# Patient Record
Sex: Female | Born: 1995 | Race: White | Hispanic: No | Marital: Single | Smoking: Never smoker
Health system: Southern US, Community
[De-identification: ages and names within clinical notes are randomized; demographics above are authoritative.]

## PROBLEM LIST (undated history)

## (undated) DIAGNOSIS — G809 Cerebral palsy, unspecified: Secondary | ICD-10-CM

## (undated) DIAGNOSIS — E119 Type 2 diabetes mellitus without complications: Secondary | ICD-10-CM

## (undated) HISTORY — PX: LEG SURGERY: SHX1003

---
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
## 2015-12-20 IMAGING — CR DG KNEE COMPLETE 4+V*R*
4 series · 4 of 4 positions shown · non-contrast
Comparison: None.

CLINICAL DATA: pain in and injury to rt knee today in weight class.
heard pop after twisting motion. hurts mostly around patella in all
directions. Initial encounter.

EXAM:
RIGHT KNEE - COMPLETE 4+ VIEW

[knee ap]
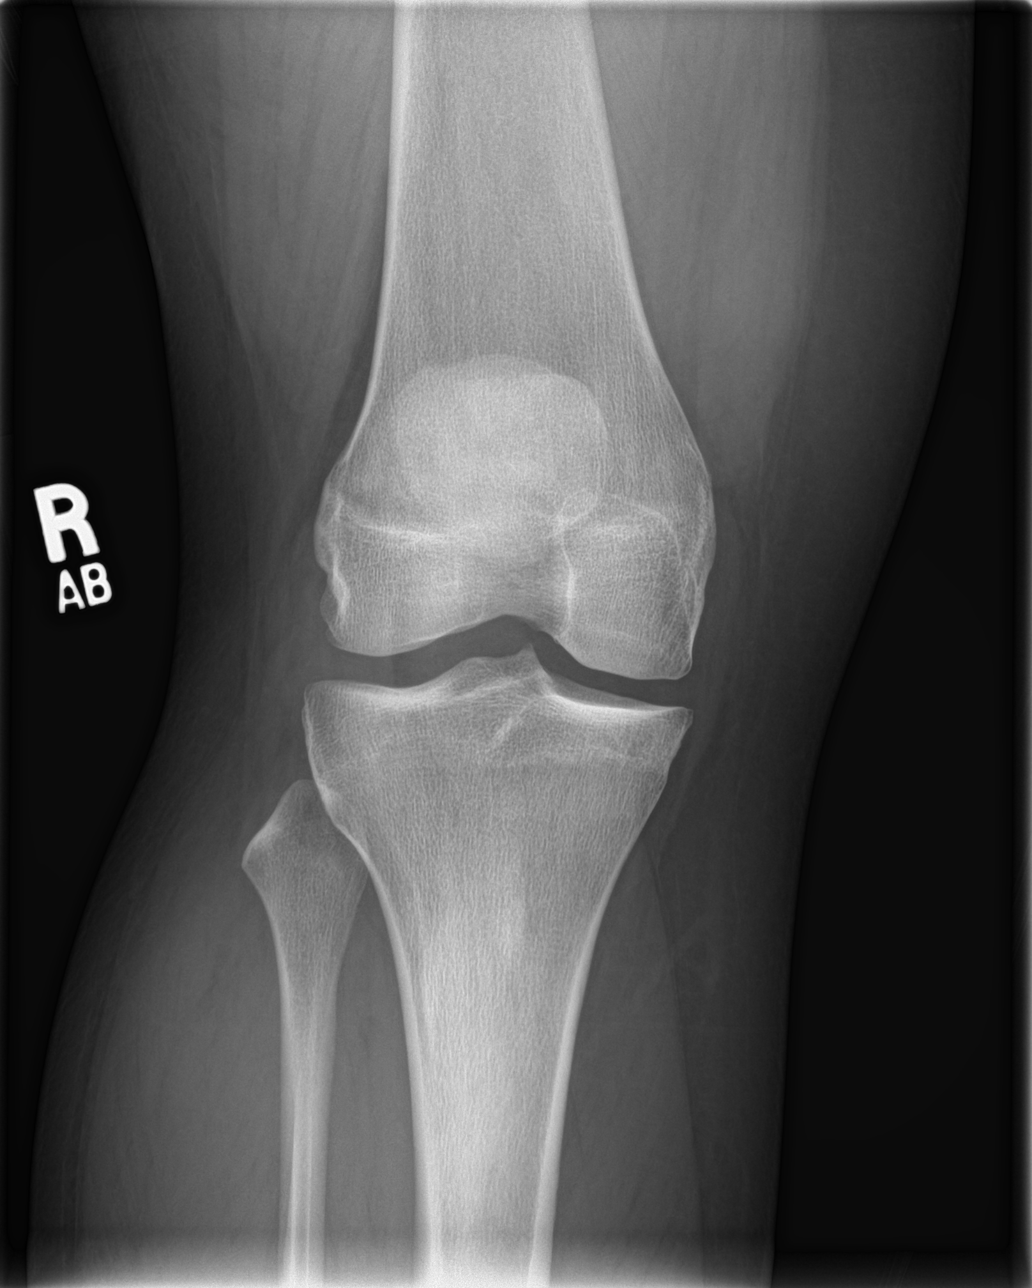

[knee obl (1 of 2)]
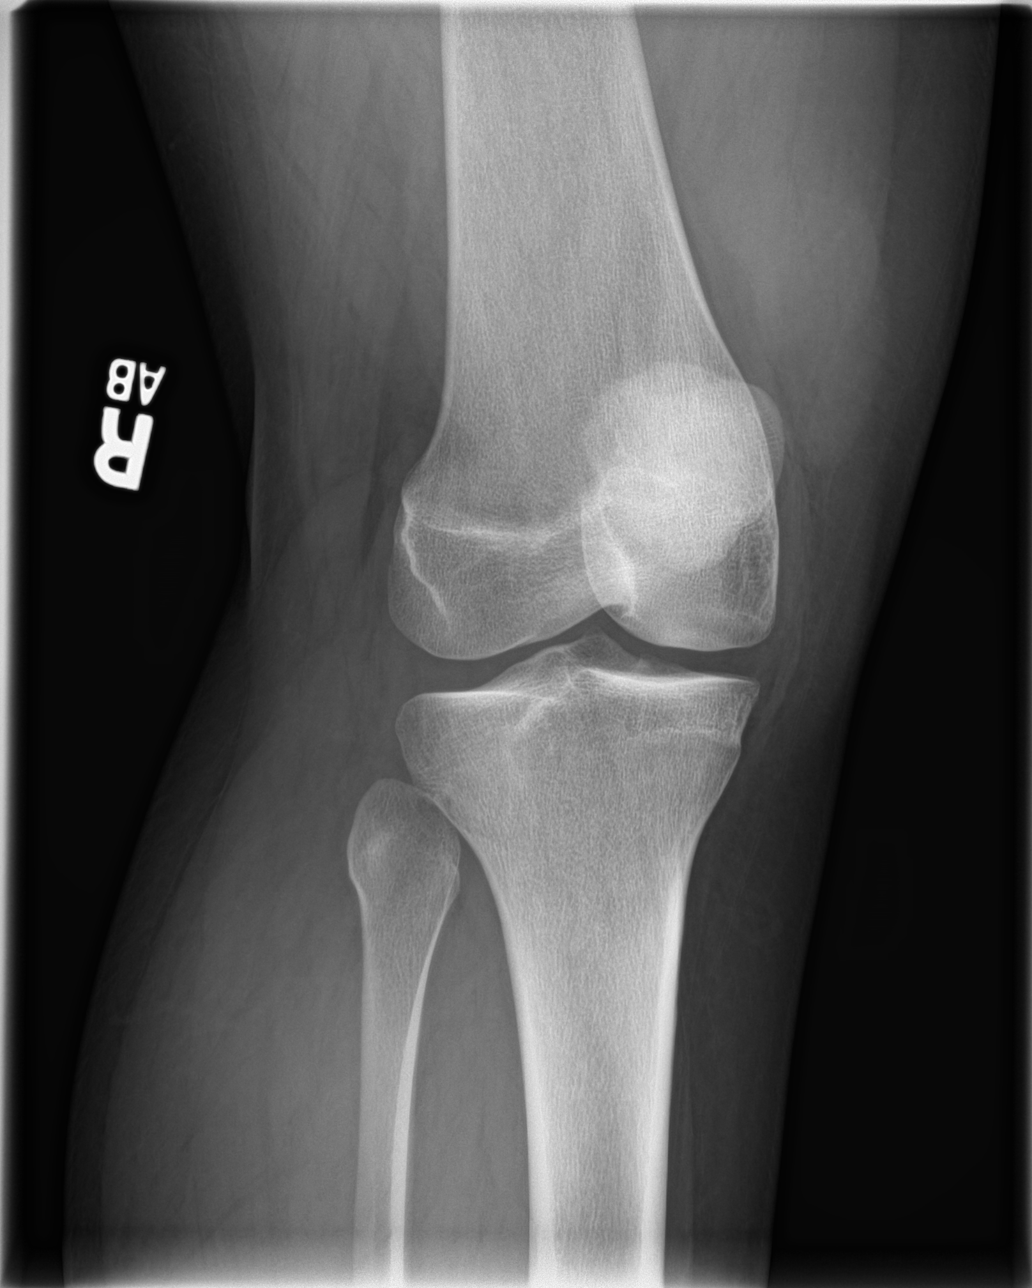

[knee obl (2 of 2)]
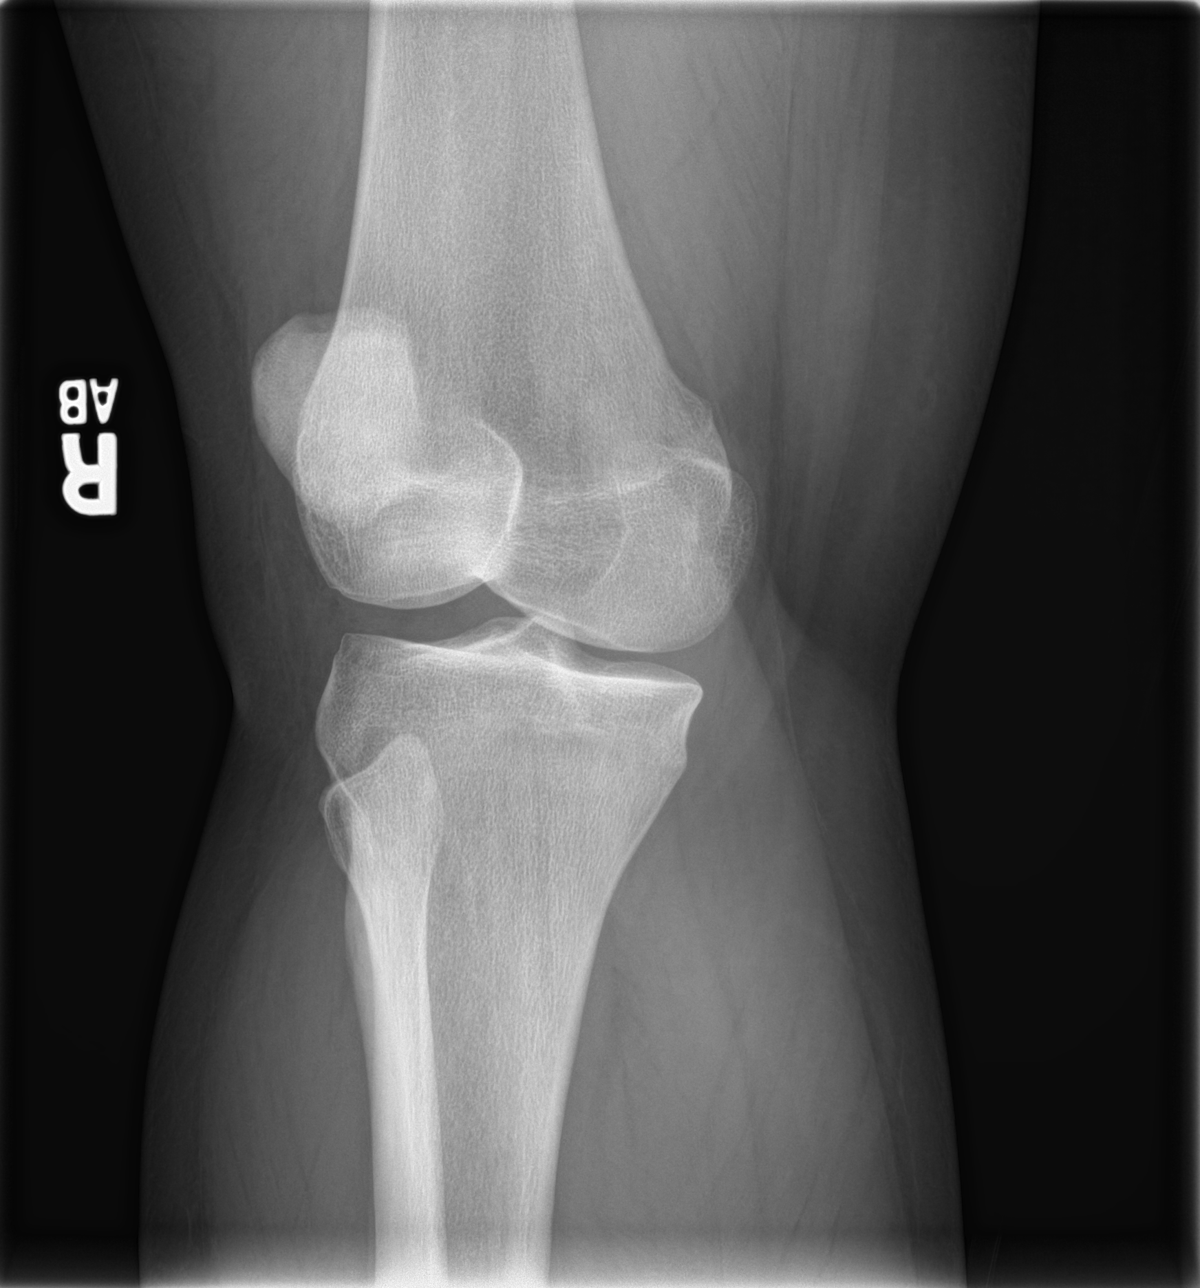

[knee lat]
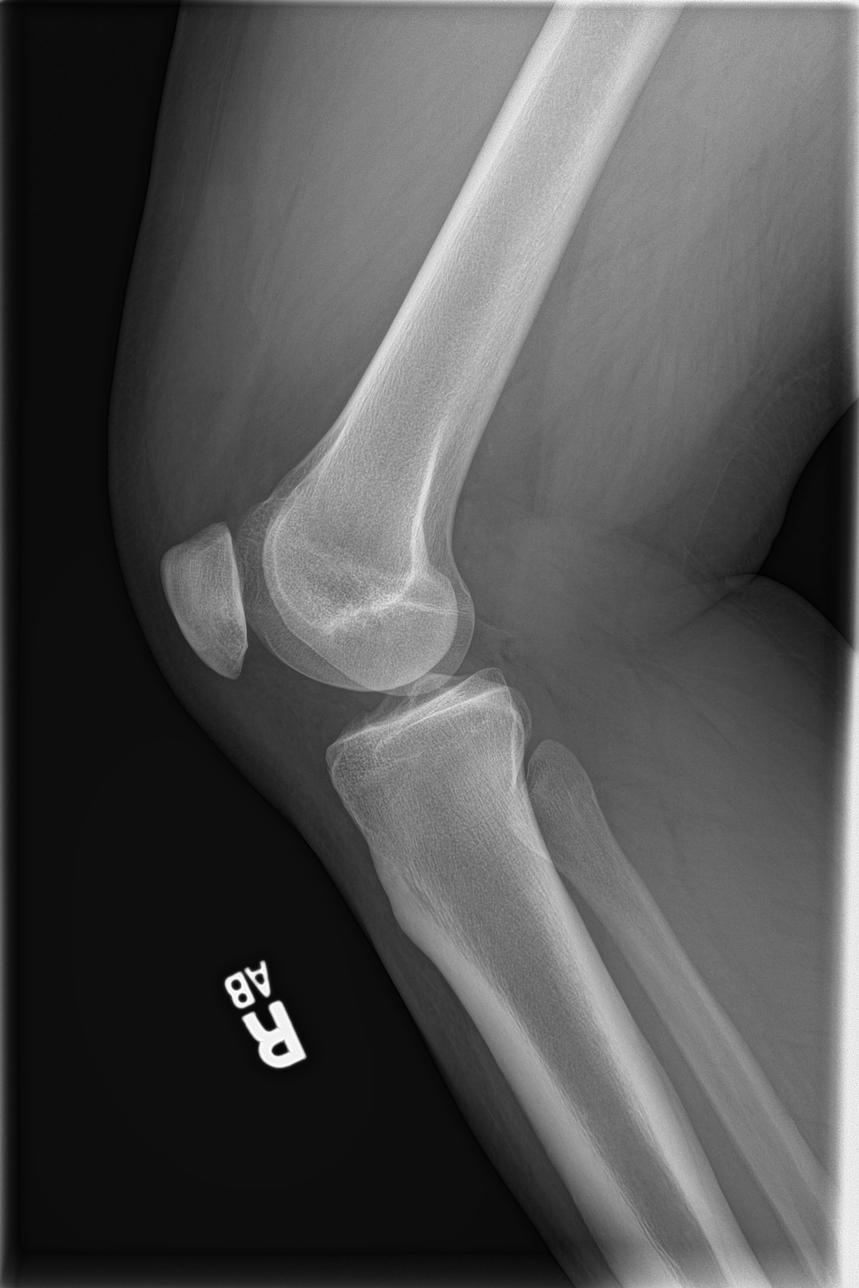

[4 of 4 positions shown; findings below may reference images not displayed]

FINDINGS: No acute fracture or dislocation.  No joint effusion.
IMPRESSION: No acute osseous abnormality.

## 2015-12-22 ENCOUNTER — Encounter: Payer: Self-pay | Admitting: *Deleted

## 2015-12-22 ENCOUNTER — Ambulatory Visit
Admission: EM | Admit: 2015-12-22 | Discharge: 2015-12-22 | Disposition: A | Discharge status: Home / Self Care | Payer: BLUE CROSS/BLUE SHIELD | Attending: Family Medicine | Admitting: Family Medicine

## 2015-12-22 DIAGNOSIS — S161XXA Strain of muscle, fascia and tendon at neck level, initial encounter: Secondary | ICD-10-CM | POA: Diagnosis not present

## 2015-12-22 DIAGNOSIS — S1091XA Abrasion of unspecified part of neck, initial encounter: Secondary | ICD-10-CM | POA: Diagnosis not present

## 2015-12-22 MED ORDER — TIZANIDINE HCL 4 MG PO CAPS: 4.0000 mg | ORAL_CAPSULE | Freq: Three times a day (TID) | ORAL | 0 refills | Status: DC

## 2015-12-22 MED ORDER — MUPIROCIN 2 % EX OINT: 1.0000 "application " | TOPICAL_OINTMENT | Freq: Three times a day (TID) | CUTANEOUS | 0 refills | Status: DC

## 2015-12-22 MED ORDER — NAPROXEN 500 MG PO TABS: 500.0000 mg | ORAL_TABLET | Freq: Two times a day (BID) | ORAL | 0 refills | Status: AC
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
## 2016-10-24 ENCOUNTER — Ambulatory Visit
Admission: EM | Admit: 2016-10-24 | Discharge: 2016-10-24 | Disposition: A | Discharge status: Home / Self Care | Payer: BLUE CROSS/BLUE SHIELD

## 2016-10-24 DIAGNOSIS — S46211A Strain of muscle, fascia and tendon of other parts of biceps, right arm, initial encounter: Secondary | ICD-10-CM

## 2016-10-24 NOTE — ED Provider Notes (Signed)
[facility]    CSN: [id number] Arrival date & time: 10/24/16  1323     History   Chief Complaint No chief complaint on file.   HPI Leslie Kent is a 21 y.o. female.   HPI  This a 21 year old female since with left anterior shoulder pain. She states that Monday 3 days prior to this visit she was helping her father fix a  lawnmower. They were attempting to change a belt. She was pulling hard on various tools and noticed afterwards that she began to have left shoulder pain. It bothered her particularly more that evening and has persisted since then. She's had no radicular symptoms.   the area of most pain is anterior over the coracoid area but does feel like he goes through her shoulder and slightly into her posterior shoulder. It hurts to move her arm. She's had no upper extremity radicular symptoms. Her neck is not uncomfortable.       Past Medical History:  Diagnosis Date  . Cerebral palsy (HCC)     There are no active problems to display for this patient.   Past Surgical History:  Procedure Laterality Date  . LEG SURGERY Left    Age 82    OB History    No data available       Home Medications    Prior to Admission medications   Medication Sig Start Date End Date Taking? Authorizing Provider  geriatric multivitamins-minerals (ELDERTONIC/GEVRABON) ELIX Take 15 mLs by mouth daily.    [provider]  mupirocin ointment (BACTROBAN) 2 % Apply 1 application topically 3 (three) times daily. 12/22/15   Lutricia Feiloemer, Cyndy Braver P, PA-C  naproxen (NAPROSYN) 500 MG tablet Take 1 tablet (500 mg total) by mouth 2 (two) times daily. 12/22/15   Lutricia Feiloemer, Davanee Klinkner P, PA-C  norgestimate-ethinyl estradiol (ORTHO-CYCLEN,SPRINTEC,PREVIFEM) 0.25-35 MG-MCG tablet Take 1 tablet by mouth daily.    [provider]  tiZANidine (ZANAFLEX) 4 MG capsule Take 1 capsule (4 mg total) by mouth 3 (three) times daily. 12/22/15   Lutricia Feiloemer, Zykiria Bruening P, PA-C    Family History Family  History  Problem Relation Age of Onset  . Thyroid disease Mother   . Diabetes Mother   . Hypertension Mother   . Hyperlipidemia Mother   . Diabetes Father   . Hypertension Father   . Hyperlipidemia Father     Social History Social History  Substance Use Topics  . Smoking status: Never Smoker  . Smokeless tobacco: Never Used  . Alcohol use No     Allergies   Patient has no known allergies.   Review of Systems Review of Systems  Constitutional: Positive for activity change. Negative for appetite change, chills, fatigue and fever.  Musculoskeletal: Positive for arthralgias and myalgias.  All other systems reviewed and are negative.    Physical Exam Triage Vital Signs ED Triage Vitals  Enc Vitals Group     BP      Pulse      Resp      Temp      Temp src      SpO2      Weight      Height      Head Circumference      Peak Flow      Pain Score      Pain Loc      Pain Edu?      Excl. in GC?    No data found.   Updated Vital Signs There  were no vitals taken for this visit.  Visual Acuity Right Eye Distance:   Left Eye Distance:   Bilateral Distance:    Right Eye Near:   Left Eye Near:    Bilateral Near:     Physical Exam  Constitutional: She is oriented to person, place, and time. She appears well-developed and well-nourished. No distress.  HENT:  Head: Normocephalic.  Eyes: Pupils are equal, round, and reactive to light.  Neck: Normal range of motion. Neck supple.  Musculoskeletal: Normal range of motion. She exhibits tenderness.  Examination of the neck shows fairly good range of motion comfort at the extremes of flexion and left rotation. Patient has tenderness over the coracoid process which reproduces her symptoms. She has tenderness over the pectoralis tendon on the left. Is a negative empty can test. She has a negative arm drop test. Range of motion of the shoulder passively is comfortable through a large range with discomfort at the extremes  particularly with abduction and external rotation. Upper extremity sensation is intact. Strength is intact at to clinical testing..  Neurological: She is alert and oriented to person, place, and time.  Skin: Skin is warm and dry. She is not diaphoretic.  Psychiatric: She has a normal mood and affect. Her behavior is normal. Thought content normal.  Nursing note and vitals reviewed.    UC Treatments / Results  Labs (all labs ordered are listed, but only abnormal results are displayed) Labs Reviewed - No data to display  EKG  EKG Interpretation None       Radiology No results found.  Procedures Procedures (including critical care time)  Medications Ordered in UC Medications - No data to display   Initial Impression / Assessment and Plan / UC Course  I have reviewed the triage vital signs and the nursing notes.  Pertinent labs & imaging results that were available during my care of the patient were reviewed by me and considered in my medical decision making (see chart for details).     Plan: 1. Test/x-ray results and diagnosis reviewed with patient 2. rx as per orders; risks, benefits, potential side effects reviewed with patient 3. Recommend supportive treatment with rest and symptom avoidance. Use Naprosyn for pain and inflammation. Patient was instructed in pendulum exercises which she will perform 3 times daily for 1 minute each time. If she is not improving she should follow-up with her primary care physician. Light duty note for work was provided with limited lifting and use of the left upper extremity to less than 10 pounds for 1 week 4. F/u prn if symptoms worsen or don't improve   Final Clinical Impressions(s) / UC Diagnoses   Final diagnoses:  Strain of biceps tendon, right, initial encounter    New Prescriptions Discharge Medication List as of 10/24/2016  5:21 PM    Naprosyn 500 mg twice a day with food   Controlled Substance Prescriptions [place] Controlled  Substance Registry consulted? Not Applicable   Lutricia Feil, PA-C 10/24/16 1610

## 2016-10-29 ENCOUNTER — Encounter: Payer: Self-pay | Admitting: Emergency Medicine

## 2016-10-29 ENCOUNTER — Ambulatory Visit
Admission: EM | Admit: 2016-10-29 | Discharge: 2016-10-29 | Disposition: A | Discharge status: Home / Self Care | Payer: BLUE CROSS/BLUE SHIELD | Attending: Family Medicine | Admitting: Family Medicine

## 2016-10-29 DIAGNOSIS — M25512 Pain in left shoulder: Secondary | ICD-10-CM

## 2016-10-29 NOTE — ED Provider Notes (Signed)
[facility] ____________________________________________  Time seen: Approximately 430 PM  I have reviewed the triage vital signs and the nursing notes.   HISTORY  Chief Complaint Shoulder Pain (left)   HPI Leslie Kent is a 21 y.o. female  present for reevaluation of left shoulder pain that is in present for 1 week. Patient reports she was seen in urgent care 5 days ago for the same complaint and was started on oral naproxen and when necessary Zanaflex which has helped. Patient states that overall she feels like she is improving, still having some mild pain. Patient states she wanted to be reevaluated as well as she needed a another work note. Denies any other injury. Patient reports initial injury was from working in a lawnmower with her father, stating that she was assisting lifting twisting and moving. Denies any fall, direct injury or abrupt onset. States pain was later that evening after resting. Denies any pain radiation, paresthesias, decreased range of motion or decreased strength. Reports she is left-hand dominant. Denies other complaints.  Denies chest pain, shortness of breath, abdominal pain, other extremity pain, extremity swelling or rash. Denies recent sickness. Denies recent antibiotic use. Patient states that she was given a work note stating no heavy lifting greater than 10 pounds for 1 week, which would be good until Wednesday. Patient states she usually does not lifting a lot of heavy things at work but occasionally has to lift about 20-30 pound car battery.  Marina GoodellFeldpausch, Dale E, MD: PCP Patient's last menstrual period was 10/08/2016 (approximate). Denies pregnancy   Past Medical History:  Diagnosis Date  . Cerebral palsy (HCC)     There are no active problems to display for this patient.   Past Surgical History:  Procedure Laterality Date  . LEG SURGERY Left    Age 11     No current facility-administered medications for this encounter.    Current Outpatient Prescriptions:  .  geriatric multivitamins-minerals (ELDERTONIC/GEVRABON) ELIX, Take 15 mLs by mouth daily., Disp: , Rfl:  .  mupirocin ointment (BACTROBAN) 2 %, Apply 1 application topically 3 (three) times daily., Disp: 22 g, Rfl: 0 .  naproxen (NAPROSYN) 500 MG tablet, Take 1 tablet (500 mg total) by mouth 2 (two) times daily., Disp: 30 tablet, Rfl: 0 .  norgestimate-ethinyl estradiol (ORTHO-CYCLEN,SPRINTEC,PREVIFEM) 0.25-35 MG-MCG tablet, Take 1 tablet by mouth daily., Disp: , Rfl:  .  tiZANidine (ZANAFLEX) 4 MG capsule, Take 1 capsule (4 mg total) by mouth 3 (three) times daily., Disp: 21 capsule, Rfl: 0  Allergies Patient has no known allergies.  Family History  Problem Relation Age of Onset  . Thyroid disease Mother   . Diabetes Mother   . Hypertension Mother   . Hyperlipidemia Mother   . Diabetes Father   . Hypertension Father   . Hyperlipidemia Father     Social History Social History  Substance Use Topics  . Smoking status: Never Smoker  . Smokeless tobacco: Never Used  . Alcohol use No    Review of Systems Constitutional: No fever/chills Cardiovascular: Denies chest pain. Respiratory: Denies shortness of breath. Gastrointestinal: No abdominal pain.  No nausea, no vomiting.   Musculoskeletal: Negative for back pain. As above. Skin: Negative for rash.   ____________________________________________   PHYSICAL EXAM:  VITAL SIGNS: ED Triage Vitals  Enc Vitals Group     BP 10/29/16 1529 (!) 141/93     Pulse Rate 10/29/16 1529 93     Resp 10/29/16 1529 16     Temp  10/29/16 1529 98.4 F (36.9 C)     Temp Source 10/29/16 1529 Oral     SpO2 10/29/16 1529 99 %     Weight 10/29/16 1527 252 lb (114.3 kg)     Height 10/29/16 1527 5\' 3"  (1.6 m)     Head Circumference --      Peak Flow --      Pain Score 10/29/16 1527 7     Pain Loc --      Pain Edu? --      Excl. in [place]? --     Constitutional: Alert and oriented. Well appearing and in  no acute distress. Cardiovascular: Normal rate, regular rhythm. Grossly normal heart sounds.  Good peripheral circulation. Respiratory: Normal respiratory effort without tachypnea nor retractions. Breath sounds are clear and equal bilaterally. No wheezes, rales, rhonchi. Musculoskeletal:   No midline cervical, thoracic or lumbar tenderness to palpation. Left anterior shoulder just below ac mild tenderness to direct palpation, no point bony tenderness, no appearance of deformity, mild pain with lateral abduction, full range of motion present, no increased pain with additional range of motion, able to abduct past 90, negative empty can test, negative Hawkins test. Bilateral hand grips strong and equal. Sensation intact bilateral upper extremities. Bilateral distal radial pulses equal and easily palpated. Neurologic:  Normal speech and language.  Speech is normal. No gait instability.  Skin:  Skin is warm, dry Psychiatric: Mood and affect are normal. Speech and behavior are normal. Patient exhibits appropriate insight and judgment   ___________________________________________   LABS (all labs ordered are listed, but only abnormal results are displayed)  Labs Reviewed - No data to display   PROCEDURES Procedures   INITIAL IMPRESSION / ASSESSMENT AND PLAN / ED COURSE  Pertinent labs & imaging results that were available during my care of the patient were reviewed by me and considered in my medical decision making (see chart for details).  Well-appearing patient. No acute distress. Suspect improved, but continued left shoulder strain. Full range of motion present, no point bony injury. As no direct, and patient reports she is improving, discussed with patient evaluation of x-ray versus deferring x-ray, patient declines x-ray at this time. Encouraged continue supportive care, continue home naproxen and Zanaflex as needed. patient states that she feels as of Wednesday she will be ready to return to  full activity. Work note given that as of Wednesday at completion of previous work note she can return to full range of motion and activity. Admission given for orthopedic as needed for continued pain and follow-up. Encourage range of motion and pendulum range of motion exercises.  Discussed follow up with Primary care physician this week. Discussed follow up and return parameters including no resolution or any worsening concerns. Patient verbalized understanding and agreed to plan.   ____________________________________________   FINAL CLINICAL IMPRESSION(S) / ED DIAGNOSES  Final diagnoses:  Acute pain of left shoulder     Discharge Medication List as of 10/29/2016  4:27 PM      Note: This dictation was prepared with Dragon dictation along with smaller phrase technology. Any transcriptional errors that result from this process are unintentional.         Renford Dills, NP 10/29/16 1704

## 2016-10-29 NOTE — ED Triage Notes (Signed)
Patient c/o ongoing pain in her left shoulder pain but states that it has improved.  Patient also needs a work note.

## 2016-10-29 NOTE — Discharge Instructions (Signed)
Take medication as prescribed. Rest. Drink plenty of fluids. Stretch.   Follow up with orthopedic as needed for continued pain.   Follow up with your primary care physician this week as needed. Return to Urgent care for new or worsening concerns.

## 2021-05-09 ENCOUNTER — Other Ambulatory Visit: Payer: Self-pay

## 2021-05-09 ENCOUNTER — Ambulatory Visit
Admission: EM | Admit: 2021-05-09 | Discharge: 2021-05-09 | Disposition: A | Discharge status: Home / Self Care | Payer: BLUE CROSS/BLUE SHIELD

## 2021-05-09 DIAGNOSIS — M792 Neuralgia and neuritis, unspecified: Secondary | ICD-10-CM | POA: Diagnosis not present

## 2021-05-09 MED ORDER — BACLOFEN 10 MG PO TABS: 10.0000 mg | ORAL_TABLET | Freq: Three times a day (TID) | ORAL | 0 refills | Status: DC

## 2021-05-09 NOTE — Discharge Instructions (Signed)
Stop the Flexeril and start taking the baclofen 3 times a day for muscle spasm.  Start taking the prednisone you were previously prescribed to calm down the nerve inflammation.  If your symptoms do not improve follow-up with orthopedics.

## 2021-05-09 NOTE — ED Provider Notes (Signed)
[facility]    CSN: [id number] Arrival date & time: 05/09/21  1411      History   Chief Complaint Chief Complaint  Patient presents with   Back Pain    HPI Leslie Kent is a 26 y.o. female.   HPI  26 year old female here for evaluation of musculoskeletal complaints.  Patient reports that 2 weeks ago she was evaluated at [facility] clinic for pain in her back that happened after she bent over to pick up her shirt out of the floor.  She denies any heavy lifting or falls.  She states when she bent over she felt a sharp pain go down her spine.  She was prescribed prednisone and Flexeril but reports that she has not been taking the prednisone and only using the Flexeril at night.  In the interim she is reporting burning pain down her left arm, into her left ear, and left cheek.  She denies any facial droop or twitch.  She also denies any headache, change in vision, numbness, tingling, or weakness in either of her upper extremities.  Past Medical History:  Diagnosis Date   Cerebral palsy ([place])     There are no problems to display for this patient.   Past Surgical History:  Procedure Laterality Date   LEG SURGERY Left    Age 52    OB History   No obstetric history on file.      Home Medications    Prior to Admission medications   Medication Sig Start Date End Date Taking? Authorizing Provider  atenolol (TENORMIN) 50 MG tablet Take 50 mg by mouth 2 (two) times daily. 05/01/21  Yes [provider]  baclofen (LIORESAL) 10 MG tablet Take 1 tablet (10 mg total) by mouth 3 (three) times daily. 05/09/21  Yes Margarette Canada, NP  geriatric multivitamins-minerals (ELDERTONIC/GEVRABON) ELIX Take 15 mLs by mouth daily.   Yes [provider]  hydrochlorothiazide (HYDRODIURIL) 25 MG tablet Take 25 mg by mouth daily. 03/19/21  Yes [provider]  metFORMIN (GLUCOPHAGE-XR) 500 MG 24 hr tablet SMARTSIG:1 Tablet(s) By Mouth Every Evening 05/01/21  Yes  [provider]  naproxen (NAPROSYN) 500 MG tablet Take 1 tablet (500 mg total) by mouth 2 (two) times daily. 12/22/15  Yes Lorin Picket, PA-C  norgestimate-ethinyl estradiol (ORTHO-CYCLEN,SPRINTEC,PREVIFEM) 0.25-35 MG-MCG tablet Take 1 tablet by mouth daily.   Yes [provider]  OZEMPIC, 0.25 OR 0.5 MG/DOSE, 2 MG/1.5ML SOPN Inject into the skin. 04/18/21  Yes [provider]  predniSONE (DELTASONE) 10 MG tablet Take by mouth. 05/01/21  Yes [provider]  telmisartan (MICARDIS) 40 MG tablet Take 40 mg by mouth daily. 01/22/21  Yes [provider]  tiZANidine (ZANAFLEX) 4 MG capsule Take 1 capsule (4 mg total) by mouth 3 (three) times daily. 12/22/15  Yes Lorin Picket, PA-C  mupirocin ointment (BACTROBAN) 2 % Apply 1 application topically 3 (three) times daily. 12/22/15   Lorin Picket, PA-C    Family History Family History  Problem Relation Age of Onset   Thyroid disease Mother    Diabetes Mother    Hypertension Mother    Hyperlipidemia Mother    Diabetes Father    Hypertension Father    Hyperlipidemia Father     Social History Social History   Tobacco Use   Smoking status: Never   Smokeless tobacco: Never  Vaping Use   Vaping Use: Never used  Substance Use Topics   Alcohol use: No  Drug use: No     Allergies   Patient has no known allergies.   Review of Systems Review of Systems  Eyes:  Negative for visual disturbance.  Skin:  Negative for rash.  Neurological:  Negative for dizziness, weakness, numbness and headaches.  Hematological: Negative.   Psychiatric/Behavioral: Negative.      Physical Exam Triage Vital Signs ED Triage Vitals  Enc Vitals Group     BP 05/09/21 1559 (!) 131/94     Pulse Rate 05/09/21 1559 65     Resp 05/09/21 1559 18     Temp 05/09/21 1559 98.8 F (37.1 C)     Temp Source 05/09/21 1559 Oral     SpO2 05/09/21 1559 100 %     Weight 05/09/21 1557 237 lb (107.5 kg)     Height  05/09/21 1557 5\' 3"  (1.6 m)     Head Circumference --      Peak Flow --      Pain Score 05/09/21 1557 8     Pain Loc --      Pain Edu? --      Excl. in [place]? --    No data found.  Updated Vital Signs BP (!) 131/94 (BP Location: Left Arm)    Pulse 65    Temp 98.8 F (37.1 C) (Oral)    Resp 18    Ht 5\' 3"  (1.6 m)    Wt 237 lb (107.5 kg)    LMP 04/08/2021    SpO2 100%    BMI 41.98 kg/m   Visual Acuity Right Eye Distance:   Left Eye Distance:   Bilateral Distance:    Right Eye Near:   Left Eye Near:    Bilateral Near:     Physical Exam Vitals and nursing note reviewed.  Constitutional:      Appearance: Normal appearance. She is obese. She is not ill-appearing.  HENT:     Head: Normocephalic and atraumatic.     Right Ear: Tympanic membrane, ear canal and external ear normal. There is no impacted cerumen.     Left Ear: Tympanic membrane, ear canal and external ear normal. There is no impacted cerumen.     Mouth/Throat:     Mouth: Mucous membranes are moist.     Pharynx: Oropharynx is clear. No posterior oropharyngeal erythema.  Eyes:     Extraocular Movements: Extraocular movements intact.     Pupils: Pupils are equal, round, and reactive to light.  Musculoskeletal:        General: No swelling or tenderness.     Cervical back: Normal range of motion and neck supple.  Lymphadenopathy:     Cervical: No cervical adenopathy.  Skin:    General: Skin is warm and dry.     Capillary Refill: Capillary refill takes less than 2 seconds.     Findings: No erythema or rash.  Neurological:     General: No focal deficit present.     Mental Status: She is alert and oriented to person, place, and time.  Psychiatric:        Mood and Affect: Mood normal.        Behavior: Behavior normal.        Thought Content: Thought content normal.        Judgment: Judgment normal.     UC Treatments / Results  Labs (all labs ordered are listed, but only abnormal results are displayed) Labs  Reviewed - No data to display  EKG   Radiology  No results found.  Procedures Procedures (including critical care time)  Medications Ordered in UC Medications - No data to display  Initial Impression / Assessment and Plan / UC Course  I have reviewed the triage vital signs and the nursing notes.  Pertinent labs & imaging results that were available during my care of the patient were reviewed by me and considered in my medical decision making (see chart for details).  Patient is a nontoxic-appearing 26 year old female here for evaluation of ongoing back pain and new onset burning in her left upper arm, left ear, and left cheek.  She denies any muscle spasm or rashes.  She denies any injury or heavy lifting.  Patient had been treated for low back pain with prednisone and Flexeril but she has not taken the prednisone.  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  She has no tenderness with palpation of her spine or her paraspinous muscles in either her upper back, mid back, or low back.  Her bilateral grips are 5/5 in bilateral upper extremity strength is 5/5.  Her face is symmetrical.  Tympanic membranes are pearly gray in appearance with normal light reflex.  There is no erythema or edema to the auricle of the left ear.  Given the patient's description of pain being burning in nature it sounds like she has nerve inflammation.  Given that she has the cervical thoracic kyphosis coupled with the shooting pain down her spine from bending over I suspect she has some degree of nerve impingement.  I have encouraged her to start taking the prednisone she was previously prescribed and see if it helps her symptoms.  I have also switched her from Flexeril to baclofen and have encouraged her to take this 3 times a day to see if it helps with symptoms.  If her symptoms not improve she should follow-up with orthopedics.   Final Clinical Impressions(s) / UC  Diagnoses   Final diagnoses:  Nerve pain     Discharge Instructions      Stop the Flexeril and start taking the baclofen 3 times a day for muscle spasm.  Start taking the prednisone you were previously prescribed to calm down the nerve inflammation.  If your symptoms do not improve follow-up with orthopedics.      ED Prescriptions     Medication Sig Dispense Auth. Provider   baclofen (LIORESAL) 10 MG tablet Take 1 tablet (10 mg total) by mouth 3 (three) times daily. 44 each Margarette Canada, NP      PDMP not reviewed this encounter.   Margarette Canada, NP 05/09/21 1721

## 2021-05-09 NOTE — ED Triage Notes (Signed)
Pt c/o back pain x2weeks.  Pt was seen and diagnosed with a pulled muscle. Pt states that the muscle spasms in her back have been moving across her back and neck. Pt was prescribed Prednisone and Flexeril. Pt has not been taking the Prednisone and took the Flexeril last night.

## 2022-04-16 ENCOUNTER — Ambulatory Visit
Admission: EM | Admit: 2022-04-16 | Discharge: 2022-04-16 | Disposition: A | Discharge status: Home / Self Care | Payer: BLUE CROSS/BLUE SHIELD

## 2022-04-16 DIAGNOSIS — M79605 Pain in left leg: Secondary | ICD-10-CM | POA: Diagnosis not present

## 2022-04-16 MED ORDER — BACLOFEN 10 MG PO TABS: 10.0000 mg | ORAL_TABLET | Freq: Two times a day (BID) | ORAL | 0 refills | Status: DC

## 2022-04-16 MED ORDER — MELOXICAM 7.5 MG PO TABS: 7.5000 mg | ORAL_TABLET | Freq: Every day | ORAL | 0 refills | Status: AC

## 2022-04-16 NOTE — ED Triage Notes (Signed)
Patient states last Wednesday she slipped on a clothes rack & fell. Pt reports LT lower leg pain down to foot since accident, reports swelling

## 2022-04-16 NOTE — ED Provider Notes (Signed)
[facility]    CSN: [id number] Arrival date & time: 04/16/22  7106      History   Chief Complaint No chief complaint on file.   HPI Leslie Kent is a 27 y.o. female.   Patient presents for evaluation of the left leg after fall that occurred 5 days prior.  Endorses that she slipped, landing on the left side with leg propped onto a box.  Has been having intermittent shin pain radiating down to the left ankle and foot.  Associated swelling.  Has been able to bear weight.  Range of motion is intact.  Has not attempted treatment of symptoms.  Endorses tibia fracture during childhood requiring 2 screw placements.   Past Medical History:  Diagnosis Date   Cerebral palsy ([place])     There are no problems to display for this patient.   Past Surgical History:  Procedure Laterality Date   LEG SURGERY Left    Age 3    OB History   No obstetric history on file.      Home Medications    Prior to Admission medications   Medication Sig Start Date End Date Taking? Authorizing Provider  atenolol (TENORMIN) 50 MG tablet Take 50 mg by mouth 2 (two) times daily. 05/01/21  Yes [provider]  geriatric multivitamins-minerals (ELDERTONIC/GEVRABON) ELIX Take 15 mLs by mouth daily.   Yes [provider]  hydrochlorothiazide (HYDRODIURIL) 25 MG tablet Take 25 mg by mouth daily. 03/19/21  Yes [provider]  metFORMIN (GLUCOPHAGE-XR) 500 MG 24 hr tablet SMARTSIG:1 Tablet(s) By Mouth Every Evening 05/01/21  Yes [provider]  Multiple Vitamin (MULTI-VITAMIN) tablet Take 1 tablet by mouth daily.   Yes [provider]  norgestimate-ethinyl estradiol (ORTHO-CYCLEN,SPRINTEC,PREVIFEM) 0.25-35 MG-MCG tablet Take 1 tablet by mouth daily.   Yes [provider]  OZEMPIC, 0.25 OR 0.5 MG/DOSE, 2 MG/1.5ML SOPN Inject into the skin. 04/18/21  Yes [provider]  telmisartan (MICARDIS) 40 MG tablet Take 40 mg by mouth daily. 01/22/21   Yes [provider]  baclofen (LIORESAL) 10 MG tablet Take 1 tablet (10 mg total) by mouth 3 (three) times daily. 05/09/21   Margarette Canada, NP  mupirocin ointment (BACTROBAN) 2 % Apply 1 application topically 3 (three) times daily. 12/22/15   Lorin Picket, PA-C  naproxen (NAPROSYN) 500 MG tablet Take 1 tablet (500 mg total) by mouth 2 (two) times daily. 12/22/15   Lorin Picket, PA-C  predniSONE (DELTASONE) 10 MG tablet Take by mouth. 05/01/21   [provider]  tiZANidine (ZANAFLEX) 4 MG capsule Take 1 capsule (4 mg total) by mouth 3 (three) times daily. 12/22/15   Lorin Picket, PA-C    Family History Family History  Problem Relation Age of Onset   Thyroid disease Mother    Diabetes Mother    Hypertension Mother    Hyperlipidemia Mother    Diabetes Father    Hypertension Father    Hyperlipidemia Father     Social History Social History   Tobacco Use   Smoking status: Never   Smokeless tobacco: Never  Vaping Use   Vaping Use: Never used  Substance Use Topics   Alcohol use: No   Drug use: No     Allergies   Patient has no known allergies.   Review of Systems Review of Systems Defer to HPI    Physical Exam Triage Vital Signs ED Triage Vitals  Enc Vitals Group     BP  04/16/22 0934 116/79     Pulse Rate 04/16/22 0934 79     Resp --      Temp 04/16/22 0934 97.7 F (36.5 C)     Temp Source 04/16/22 0934 Oral     SpO2 04/16/22 0934 98 %     Weight 04/16/22 0932 245 lb (111.1 kg)     Height 04/16/22 0932 5\' 3"  (1.6 m)     Head Circumference --      Peak Flow --      Pain Score 04/16/22 0932 8     Pain Loc --      Pain Edu? --      Excl. in [place]? --    No data found.  Updated Vital Signs BP 116/79 (BP Location: Left Arm)   Pulse 79   Temp 97.7 F (36.5 C) (Oral)   Ht 5\' 3"  (1.6 m)   Wt 245 lb (111.1 kg)   LMP 03/16/2022   SpO2 98%   BMI 43.40 kg/m   Visual Acuity Right Eye Distance:   Left Eye Distance:   Bilateral  Distance:    Right Eye Near:   Left Eye Near:    Bilateral Near:     Physical Exam Constitutional:      Appearance: Normal appearance.  HENT:     Head: Normocephalic.  Eyes:     Extraocular Movements: Extraocular movements intact.  Pulmonary:     Effort: Pulmonary effort is normal.  Musculoskeletal:     Comments: Unable to reproduce tenderness on exam to the shin or left ankle, no swelling, ecchymosis or deformity noted, able to bear weight, range of motion is intact, 2+ dorsalis pedis pulse, sensation intact  Neurological:     Mental Status: She is alert and oriented to person, place, and time.      UC Treatments / Results  Labs (all labs ordered are listed, but only abnormal results are displayed) Labs Reviewed - No data to display  EKG   Radiology No results found.  Procedures Procedures (including critical care time)  Medications Ordered in UC Medications - No data to display  Initial Impression / Assessment and Plan / UC Course  I have reviewed the triage vital signs and the nursing notes.  Pertinent labs & imaging results that were available during my care of the patient were reviewed by me and considered in my medical decision making (see chart for details).  Left leg pain  As there is no current no abnormality on exam will defer imaging, discussed with patient in agreement with plan, recommended conservative management, prescribed meloxicam and baclofen for outpatient use, does continue to persist Final Clinical Impressions(s) / UC Diagnoses   Final diagnoses:  None   Discharge Instructions   None    ED Prescriptions   None    PDMP not reviewed this encounter.   Hans Eden, [place] 04/16/22 [phone]

## 2022-04-16 NOTE — Discharge Instructions (Signed)
Believe your pain is directly related to your fall however leaves that there is a break in the bone as generally this pain does not come and go   Would like you to monitor closely  Begin meloxicam every morning for 5 days, this reduces inflammation daily will help to calm down pain, you may take Tylenol in addition to this as needed  You may use baclofen twice daily as needed for additional comfort, be mindful muscle relaxant can make you feel drowsy  You may use ice or heat over the affected areas  When swelling is present please elevate on pillows when sitting and lying for management  You may continue all activity as tolerated  If your symptoms continue to persist you may follow-up with your primary doctor or orthopedics which are the specialist, information is listed on front page

## 2022-06-18 ENCOUNTER — Other Ambulatory Visit: Payer: Self-pay

## 2022-06-18 DIAGNOSIS — R748 Abnormal levels of other serum enzymes: Secondary | ICD-10-CM

## 2022-06-21 ENCOUNTER — Ambulatory Visit
Admission: RE | Admit: 2022-06-21 | Discharge: 2022-06-21 | Disposition: A | Discharge status: Home / Self Care | Payer: BLUE CROSS/BLUE SHIELD | Source: Ambulatory Visit | Attending: Family Medicine | Admitting: Family Medicine

## 2022-06-21 DIAGNOSIS — R748 Abnormal levels of other serum enzymes: Secondary | ICD-10-CM | POA: Insufficient documentation

## 2022-10-11 ENCOUNTER — Other Ambulatory Visit: Payer: Self-pay | Admitting: Gastroenterology

## 2022-10-11 DIAGNOSIS — R748 Abnormal levels of other serum enzymes: Secondary | ICD-10-CM

## 2022-10-23 ENCOUNTER — Other Ambulatory Visit: Payer: BLUE CROSS/BLUE SHIELD

## 2023-04-12 ENCOUNTER — Encounter: Payer: Self-pay | Admitting: Emergency Medicine

## 2023-04-12 ENCOUNTER — Ambulatory Visit
Admission: EM | Admit: 2023-04-12 | Discharge: 2023-04-12 | Disposition: A | Discharge status: Home / Self Care | Payer: Managed Care, Other (non HMO) | Attending: Emergency Medicine | Admitting: Emergency Medicine

## 2023-04-12 DIAGNOSIS — J069 Acute upper respiratory infection, unspecified: Secondary | ICD-10-CM

## 2023-04-12 HISTORY — DX: Type 2 diabetes mellitus without complications: E11.9

## 2023-04-12 MED ORDER — PROMETHAZINE-DM 6.25-15 MG/5ML PO SYRP: 5.0000 mL | ORAL_SOLUTION | Freq: Four times a day (QID) | ORAL | 0 refills | Status: AC | PRN

## 2023-04-12 MED ORDER — BENZONATATE 100 MG PO CAPS: 200.0000 mg | ORAL_CAPSULE | Freq: Three times a day (TID) | ORAL | 0 refills | Status: AC

## 2023-04-12 MED ORDER — IPRATROPIUM BROMIDE 0.06 % NA SOLN: 2.0000 | Freq: Four times a day (QID) | NASAL | 12 refills | Status: AC

## 2023-04-12 NOTE — ED Provider Notes (Signed)
[facility]    CSN: [id number] Arrival date & time: 04/12/23  1431      History   Chief Complaint Chief Complaint  Patient presents with   Nasal Congestion   Cough    HPI Leslie Kent is a 28 y.o. female.   HPI  28 year old female with past medical history significant for cerebral palsy presents for evaluation of of respiratory symptoms that began 5 days ago.  These include runny nose and nasal congestion, sore throat for 1 day that resolved, and a cough that is intermittently productive for a green sputum.  She denies fever, ear pain, shortness breath, or wheezing.  Past Medical History:  Diagnosis Date   Cerebral palsy (HCC)    Diabetes mellitus without complication (HCC)     There are no active problems to display for this patient.   Past Surgical History:  Procedure Laterality Date   LEG SURGERY Left    Age 2    OB History   No obstetric history on file.      Home Medications    Prior to Admission medications   Medication Sig Start Date End Date Taking? Authorizing Provider  benzonatate (TESSALON) 100 MG capsule Take 2 capsules (200 mg total) by mouth every 8 (eight) hours. 04/12/23  Yes Becky Augusta, NP  ipratropium (ATROVENT) 0.06 % nasal spray Place 2 sprays into both nostrils 4 (four) times daily. 04/12/23  Yes Becky Augusta, NP  promethazine-dextromethorphan (PROMETHAZINE-DM) 6.25-15 MG/5ML syrup Take 5 mLs by mouth 4 (four) times daily as needed. 04/12/23  Yes Becky Augusta, NP  atenolol (TENORMIN) 50 MG tablet Take 50 mg by mouth 2 (two) times daily. 05/01/21  Yes [provider]  geriatric multivitamins-minerals (ELDERTONIC/GEVRABON) ELIX Take 15 mLs by mouth daily.    [provider]  hydrochlorothiazide (HYDRODIURIL) 25 MG tablet Take 25 mg by mouth daily. 03/19/21  Yes [provider]  meloxicam (MOBIC) 7.5 MG tablet Take 1 tablet (7.5 mg total) by mouth daily. 04/16/22   Valinda Hoar, NP  metFORMIN  (GLUCOPHAGE-XR) 500 MG 24 hr tablet SMARTSIG:1 Tablet(s) By Mouth Every Evening 05/01/21  Yes [provider]  Multiple Vitamin (MULTI-VITAMIN) tablet Take 1 tablet by mouth daily.    [provider]  naproxen (NAPROSYN) 500 MG tablet Take 1 tablet (500 mg total) by mouth 2 (two) times daily. 12/22/15   Lutricia Feil, PA-C  norgestimate-ethinyl estradiol (ORTHO-CYCLEN,SPRINTEC,PREVIFEM) 0.25-35 MG-MCG tablet Take 1 tablet by mouth daily.    [provider]  OZEMPIC, 0.25 OR 0.5 MG/DOSE, 2 MG/1.5ML SOPN Inject into the skin. 04/18/21   [provider]  telmisartan (MICARDIS) 40 MG tablet Take 40 mg by mouth daily. 01/22/21   [provider]    Family History Family History  Problem Relation Age of Onset   Thyroid disease Mother    Diabetes Mother    Hypertension Mother    Hyperlipidemia Mother    Diabetes Father    Hypertension Father    Hyperlipidemia Father     Social History Social History   Tobacco Use   Smoking status: Never   Smokeless tobacco: Never  Vaping Use   Vaping status: Never Used  Substance Use Topics   Alcohol use: No   Drug use: No     Allergies   Patient has no known allergies.   Review of Systems Review of Systems  Constitutional:  Negative for fever.  HENT:  Positive for congestion, rhinorrhea and sore throat. Negative for  ear pain.   Respiratory:  Positive for cough. Negative for shortness of breath and wheezing.      Physical Exam Triage Vital Signs ED Triage Vitals  Encounter Vitals Group     BP      Systolic BP Percentile      Diastolic BP Percentile      Pulse      Resp      Temp      Temp src      SpO2      Weight      Height      Head Circumference      Peak Flow      Pain Score      Pain Loc      Pain Education      Exclude from Growth Chart    No data found.  Updated Vital Signs BP 122/81 (BP Location: Right Arm)   Pulse 78   Temp 98.3 F (36.8 C) (Oral)   Resp 14   Ht  5\' 3"  (1.6 m)   Wt 244 lb 14.9 oz (111.1 kg)   LMP 03/19/2023 (Approximate)   SpO2 97%   BMI 43.39 kg/m   Visual Acuity Right Eye Distance:   Left Eye Distance:   Bilateral Distance:    Right Eye Near:   Left Eye Near:    Bilateral Near:     Physical Exam Vitals and nursing note reviewed.  Constitutional:      Appearance: Normal appearance. She is not ill-appearing.  HENT:     Head: Normocephalic and atraumatic.     Right Ear: Tympanic membrane, ear canal and external ear normal. There is no impacted cerumen.     Left Ear: Tympanic membrane, ear canal and external ear normal. There is no impacted cerumen.     Nose: Congestion and rhinorrhea present.     Comments: Nasal mucosa is mildly erythematous and edematous with scant clear discharge in both nares.    Mouth/Throat:     Mouth: Mucous membranes are moist.     Pharynx: Oropharynx is clear. No oropharyngeal exudate or posterior oropharyngeal erythema.  Cardiovascular:     Rate and Rhythm: Normal rate and regular rhythm.     Pulses: Normal pulses.     Heart sounds: Normal heart sounds. No murmur heard.    No friction rub. No gallop.  Pulmonary:     Effort: Pulmonary effort is normal.     Breath sounds: Normal breath sounds. No wheezing, rhonchi or rales.  Musculoskeletal:     Cervical back: Normal range of motion and neck supple. No tenderness.  Lymphadenopathy:     Cervical: No cervical adenopathy.  Skin:    General: Skin is warm and dry.     Capillary Refill: Capillary refill takes less than 2 seconds.     Findings: No rash.  Neurological:     General: No focal deficit present.     Mental Status: She is alert and oriented to person, place, and time.      UC Treatments / Results  Labs (all labs ordered are listed, but only abnormal results are displayed) Labs Reviewed - No data to display  EKG   Radiology No results found.  Procedures Procedures (including critical care time)  Medications Ordered in  UC Medications - No data to display  Initial Impression / Assessment and Plan / UC Course  I have reviewed the triage vital signs and the nursing notes.  Pertinent labs & imaging results  that were available during my care of the patient were reviewed by me and considered in my medical decision making (see chart for details).   Patient is a nontoxic-appearing 28 year old female presenting for evaluation of 5 days with the respiratory symptoms as outlined HPI above.  Her physical exam does reveal inflammation of her nasal mucosa with clear rhinorrhea.  Oropharyngeal exam is benign.  Cardiopulmonary exam reveals clear lung sounds all fields.  Given the patient's had symptoms for the last 5 days she is outside the therapeutic window for antivirals for COVID or influenza swab would not test her for either at this time.  I will treat her for viral URI with a cough with Atrovent nasal spray, Tessalon Perles, and Promethazine DM cough syrup.  Return precautions reviewed.  She denies need for work note.   Final Clinical Impressions(s) / UC Diagnoses   Final diagnoses:  Viral URI with cough     Discharge Instructions      Your exam is consistent with a viral upper respiratory infection.  Please use over-the-counter Tylenol and/or ibuprofen according the package instructions as needed for any fever or pain.  Use the Atrovent nasal spray, 2 squirts in each nostril every 6 hours, as needed for runny nose and postnasal drip.  Use the Tessalon Perles every 8 hours during the day.  Take them with a small sip of water.  They may give you some numbness to the base of your tongue or a metallic taste in your mouth, this is normal.  Use the Promethazine DM cough syrup at bedtime for cough and congestion.  It will make you drowsy so do not take it during the day.  Return for reevaluation or see your primary care provider for any new or worsening symptoms.      ED Prescriptions     Medication Sig  Dispense Auth. Provider   benzonatate (TESSALON) 100 MG capsule Take 2 capsules (200 mg total) by mouth every 8 (eight) hours. 21 capsule Becky Augusta, NP   ipratropium (ATROVENT) 0.06 % nasal spray Place 2 sprays into both nostrils 4 (four) times daily. 15 mL Becky Augusta, NP   promethazine-dextromethorphan (PROMETHAZINE-DM) 6.25-15 MG/5ML syrup Take 5 mLs by mouth 4 (four) times daily as needed. 118 mL Becky Augusta, NP      PDMP not reviewed this encounter.   Becky Augusta, NP 04/12/23 1529

## 2023-04-12 NOTE — ED Triage Notes (Signed)
Patient c/o cough, chest congestion, nasal congestion and runny nose that started on 04/03/23.  Patient denies fevers.

## 2023-04-12 NOTE — Discharge Instructions (Addendum)
Your exam is consistent with a viral upper respiratory infection.  Please use over-the-counter Tylenol and/or ibuprofen according the package instructions as needed for any fever or pain.  Use the Atrovent nasal spray, 2 squirts in each nostril every 6 hours, as needed for runny nose and postnasal drip.  Use the Tessalon Perles every 8 hours during the day.  Take them with a small sip of water.  They may give you some numbness to the base of your tongue or a metallic taste in your mouth, this is normal.  Use the Promethazine DM cough syrup at bedtime for cough and congestion.  It will make you drowsy so do not take it during the day.  Return for reevaluation or see your primary care provider for any new or worsening symptoms.
